# Patient Record
Sex: Female | Born: 1980 | Race: Black or African American | Hispanic: No | Marital: Single | Smoking: Current every day smoker
Health system: Southern US, Community
[De-identification: ages and names within clinical notes are randomized; demographics above are authoritative.]

## PROBLEM LIST (undated history)

## (undated) DIAGNOSIS — N289 Disorder of kidney and ureter, unspecified: Secondary | ICD-10-CM

## (undated) DIAGNOSIS — E611 Iron deficiency: Secondary | ICD-10-CM

## (undated) DIAGNOSIS — J45909 Unspecified asthma, uncomplicated: Secondary | ICD-10-CM

## (undated) HISTORY — PX: TUBAL LIGATION: SHX77

## (undated) HISTORY — PX: BREAST SURGERY: SHX581

---

## 2015-08-07 ENCOUNTER — Emergency Department (HOSPITAL_COMMUNITY): Payer: Self-pay

## 2015-08-07 ENCOUNTER — Emergency Department (HOSPITAL_COMMUNITY)
Admission: EM | Admit: 2015-08-07 | Discharge: 2015-08-07 | Disposition: A | Discharge status: Home / Self Care | Payer: Self-pay | Attending: Emergency Medicine | Admitting: Emergency Medicine

## 2015-08-07 ENCOUNTER — Encounter (HOSPITAL_COMMUNITY): Payer: Self-pay | Admitting: *Deleted

## 2015-08-07 DIAGNOSIS — Q438 Other specified congenital malformations of intestine: Secondary | ICD-10-CM | POA: Insufficient documentation

## 2015-08-07 DIAGNOSIS — R102 Pelvic and perineal pain: Secondary | ICD-10-CM

## 2015-08-07 DIAGNOSIS — J45909 Unspecified asthma, uncomplicated: Secondary | ICD-10-CM | POA: Insufficient documentation

## 2015-08-07 DIAGNOSIS — Z79899 Other long term (current) drug therapy: Secondary | ICD-10-CM | POA: Insufficient documentation

## 2015-08-07 DIAGNOSIS — K529 Noninfective gastroenteritis and colitis, unspecified: Secondary | ICD-10-CM

## 2015-08-07 DIAGNOSIS — R1032 Left lower quadrant pain: Secondary | ICD-10-CM

## 2015-08-07 DIAGNOSIS — K6389 Other specified diseases of intestine: Secondary | ICD-10-CM

## 2015-08-07 DIAGNOSIS — N939 Abnormal uterine and vaginal bleeding, unspecified: Secondary | ICD-10-CM | POA: Insufficient documentation

## 2015-08-07 DIAGNOSIS — F17210 Nicotine dependence, cigarettes, uncomplicated: Secondary | ICD-10-CM | POA: Insufficient documentation

## 2015-08-07 HISTORY — DX: Iron deficiency: E61.1

## 2015-08-07 HISTORY — DX: Unspecified asthma, uncomplicated: J45.909

## 2015-08-07 HISTORY — DX: Disorder of kidney and ureter, unspecified: N28.9

## 2015-08-07 LAB — URINALYSIS, ROUTINE W REFLEX MICROSCOPIC
Bilirubin Urine: NEGATIVE
GLUCOSE, UA: NEGATIVE mg/dL
Ketones, ur: NEGATIVE mg/dL
Leukocytes, UA: NEGATIVE
Nitrite: NEGATIVE
PH: 6 (ref 5.0–8.0)
PROTEIN: NEGATIVE mg/dL
Specific Gravity, Urine: 1.025 (ref 1.005–1.030)

## 2015-08-07 LAB — LIPASE, BLOOD: LIPASE: 26 U/L (ref 11–51)

## 2015-08-07 LAB — CBC WITH DIFFERENTIAL/PLATELET
BASOS ABS: 0.1 10*3/uL (ref 0.0–0.1)
Basophils Relative: 1 %
EOS PCT: 3 %
Eosinophils Absolute: 0.3 10*3/uL (ref 0.0–0.7)
HCT: 33.2 % — ABNORMAL LOW (ref 36.0–46.0)
Hemoglobin: 10.7 g/dL — ABNORMAL LOW (ref 12.0–15.0)
LYMPHS ABS: 2.3 10*3/uL (ref 0.7–4.0)
LYMPHS PCT: 25 %
MCH: 25.2 pg — AB (ref 26.0–34.0)
MCHC: 32.2 g/dL (ref 30.0–36.0)
MCV: 78.1 fL (ref 78.0–100.0)
MONO ABS: 0.8 10*3/uL (ref 0.1–1.0)
MONOS PCT: 8 %
Neutro Abs: 6.1 10*3/uL (ref 1.7–7.7)
Neutrophils Relative %: 63 %
PLATELETS: 393 10*3/uL (ref 150–400)
RBC: 4.25 MIL/uL (ref 3.87–5.11)
RDW: 13.7 % (ref 11.5–15.5)
WBC: 9.5 10*3/uL (ref 4.0–10.5)

## 2015-08-07 LAB — COMPREHENSIVE METABOLIC PANEL
ALT: 18 U/L (ref 14–54)
AST: 20 U/L (ref 15–41)
Albumin: 3.9 g/dL (ref 3.5–5.0)
Alkaline Phosphatase: 82 U/L (ref 38–126)
Anion gap: 4 — ABNORMAL LOW (ref 5–15)
BUN: 16 mg/dL (ref 6–20)
CHLORIDE: 105 mmol/L (ref 101–111)
CO2: 27 mmol/L (ref 22–32)
CREATININE: 0.8 mg/dL (ref 0.44–1.00)
Calcium: 8.7 mg/dL — ABNORMAL LOW (ref 8.9–10.3)
Glucose, Bld: 116 mg/dL — ABNORMAL HIGH (ref 65–99)
POTASSIUM: 4.1 mmol/L (ref 3.5–5.1)
SODIUM: 136 mmol/L (ref 135–145)
Total Bilirubin: 0.5 mg/dL (ref 0.3–1.2)
Total Protein: 7.5 g/dL (ref 6.5–8.1)

## 2015-08-07 LAB — WET PREP, GENITAL
SPERM: NONE SEEN
TRICH WET PREP: NONE SEEN
YEAST WET PREP: NONE SEEN

## 2015-08-07 LAB — URINE MICROSCOPIC-ADD ON: WBC, UA: NONE SEEN WBC/hpf (ref 0–5)

## 2015-08-07 MED ORDER — OXYCODONE HCL 5 MG PO TABS
5.0000 mg | ORAL_TABLET | Freq: Once | ORAL | Status: AC
  Administered 2015-08-07: 5 mg via ORAL
  Filled 2015-08-07: qty 1

## 2015-08-07 MED ORDER — ONDANSETRON HCL 4 MG/2ML IJ SOLN
4.0000 mg | Freq: Once | INTRAMUSCULAR | Status: AC
  Administered 2015-08-07: 4 mg via INTRAVENOUS
  Filled 2015-08-07: qty 2

## 2015-08-07 MED ORDER — MORPHINE SULFATE (PF) 4 MG/ML IV SOLN
4.0000 mg | Freq: Once | INTRAVENOUS | Status: AC
Start: 2015-08-07 — End: 2015-08-07
  Administered 2015-08-07: 4 mg via INTRAVENOUS
  Filled 2015-08-07: qty 1

## 2015-08-07 MED ORDER — DIATRIZOATE MEGLUMINE & SODIUM 66-10 % PO SOLN
ORAL | Status: AC
  Filled 2015-08-07: qty 30

## 2015-08-07 MED ORDER — SODIUM CHLORIDE 0.9 % IV BOLUS (SEPSIS)
1000.0000 mL | Freq: Once | INTRAVENOUS | Status: AC
  Administered 2015-08-07: 1000 mL via INTRAVENOUS

## 2015-08-07 MED ORDER — IOPAMIDOL (ISOVUE-300) INJECTION 61%
100.0000 mL | Freq: Once | INTRAVENOUS | Status: AC | PRN
  Administered 2015-08-07: 100 mL via INTRAVENOUS

## 2015-08-07 MED ORDER — MORPHINE SULFATE (PF) 4 MG/ML IV SOLN
INTRAVENOUS | Status: AC
  Filled 2015-08-07: qty 1

## 2015-08-07 MED ORDER — MORPHINE SULFATE (PF) 4 MG/ML IV SOLN
4.0000 mg | Freq: Once | INTRAVENOUS | Status: AC
  Administered 2015-08-07: 4 mg via INTRAVENOUS

## 2015-08-07 NOTE — ED Provider Notes (Signed)
[facility] DEPT Provider Note   CSN: [id number] Arrival date & time: 08/07/15  6962  First Provider Contact:  First MD Initiated Contact with Patient 08/07/15 [phone]        History   Chief Complaint Chief Complaint  Patient presents with  . Abdominal Pain    HPI Sophia Levine is a 35 y.o. female.  Patient with L sided abdominal pain that onset during the day yesterday.  She was seen at [facility] ED last night, had a CT scan that showed "appendicitis" but was sent home because "I don't have insurance".  [place] discharge summary shows diagnosis of "epiploic appendigitis".  Today pain has worsened and she has had several episode of nausea and vomiting. One episode of diarrhea.  On menses now. No abnormal vaginal bleeding or discharge. No dysuria or hematuria.  No previous abdominal surgeries, has never had this abdominal pain before.    The history is provided by the patient and a relative.  Abdominal Pain   Associated symptoms include diarrhea, nausea and vomiting. Pertinent negatives include fever, dysuria, hematuria, arthralgias and myalgias.    Past Medical History:  Diagnosis Date  . Asthma   . Iron deficiency   . Renal disorder    kidney stones    There are no active problems to display for this patient.   Past Surgical History:  Procedure Laterality Date  . BREAST SURGERY     reduction  . TUBAL LIGATION      OB History    No data available       Home Medications    Prior to Admission medications   Medication Sig Start Date End Date Taking? Authorizing Provider  albuterol (ACCUNEB) 0.63 MG/3ML nebulizer solution Take 1 ampule by nebulization every 6 (six) hours as needed for wheezing.   Yes Historical Provider, MD  Albuterol Sulfate 108 (90 Base) MCG/ACT AEPB Inhale into the lungs.   Yes Historical Provider, MD    Family History No family history on file.  Social History Social History  Substance Use Topics  . Smoking status: Current Every Day  Smoker    Packs/day: 0.50    Types: Cigarettes  . Smokeless tobacco: Never Used  . Alcohol use Yes     Comment: occ.     Allergies   Tylenol [acetaminophen]   Review of Systems Review of Systems  Constitutional: Positive for activity change and appetite change. Negative for fever.  HENT: Negative for ear discharge and voice change.   Respiratory: Negative for cough, chest tightness and shortness of breath.   Cardiovascular: Negative for chest pain.  Gastrointestinal: Positive for abdominal pain, diarrhea, nausea and vomiting.  Genitourinary: Positive for vaginal bleeding. Negative for dysuria, hematuria and vaginal discharge.  Musculoskeletal: Positive for back pain. Negative for arthralgias and myalgias.  Neurological: Negative for dizziness, weakness and light-headedness.   A complete 10 system review of systems was obtained and all systems are negative except as noted in the HPI and PMH.    Physical Exam Updated Vital Signs BP 127/96 (BP Location: Left Arm)   Pulse 62   Temp 97.6 F (36.4 C) (Oral)   Resp 20   Ht  (1.626 m)   Wt 170 lb (77.1 kg)   LMP 08/02/2015 (Exact Date)   SpO2 100%   BMI 29.18 kg/m   Physical Exam  Constitutional: She is oriented to person, place, and time. She appears well-developed and well-nourished. No distress.  uncomfortable  HENT:  Head: Normocephalic and atraumatic.  Mouth/Throat: Oropharynx is clear and moist. No oropharyngeal exudate.  Eyes: Conjunctivae and EOM are normal. Pupils are equal, round, and reactive to light.  Neck: Normal range of motion. Neck supple.  No meningismus.  Cardiovascular: Normal rate, regular rhythm, normal heart sounds and intact distal pulses.   No murmur heard. Pulmonary/Chest: Effort normal and breath sounds normal. No respiratory distress.  Abdominal: Soft. There is tenderness. There is guarding. There is no rebound.  TTP LLQ with guarding  Genitourinary:  Genitourinary Comments: Chaperone  present. Normal external genitalia. Dark blood in vaginal vault. No CMT. Left-sided adnexal tenderness.  Musculoskeletal: Normal range of motion. She exhibits no edema or tenderness.  Paraspinal lumbar tenderness  Neurological: She is alert and oriented to person, place, and time. No cranial nerve deficit. She exhibits normal muscle tone. Coordination normal.  No ataxia on finger to nose bilaterally. No pronator drift. 5/5 strength throughout. CN 2-12 intact.Equal grip strength. Sensation intact.   Skin: Skin is warm.  Psychiatric: She has a normal mood and affect. Her behavior is normal.  Nursing note and vitals reviewed.    ED Treatments / Results  Labs (all labs ordered are listed, but only abnormal results are displayed) Labs Reviewed  WET PREP, GENITAL - Abnormal; Notable for the following:       Result Value   Clue Cells Wet Prep HPF POC PRESENT (*)    WBC, Wet Prep HPF POC FEW (*)    All other components within normal limits  URINALYSIS, ROUTINE W REFLEX MICROSCOPIC (NOT AT [facility]) - Abnormal; Notable for the following:    Hgb urine dipstick LARGE (*)    All other components within normal limits  CBC WITH DIFFERENTIAL/PLATELET - Abnormal; Notable for the following:    Hemoglobin 10.7 (*)    HCT 33.2 (*)    MCH 25.2 (*)    All other components within normal limits  COMPREHENSIVE METABOLIC PANEL - Abnormal; Notable for the following:    Glucose, Bld 116 (*)    Calcium 8.7 (*)    Anion gap 4 (*)    All other components within normal limits  URINE MICROSCOPIC-ADD ON - Abnormal; Notable for the following:    Squamous Epithelial / LPF 0-5 (*)    Bacteria, UA FEW (*)    All other components within normal limits  LIPASE, BLOOD  GC/CHLAMYDIA PROBE AMP ([place]) NOT AT [facility]    EKG  EKG Interpretation None       Radiology US Transvaginal Non-ob  Result Date: 08/07/2015 CLINICAL DATA:  Pelvic pain for 2 days. History of prior tubal ligation. EXAM: TRANSABDOMINAL AND  TRANSVAGINAL ULTRASOUND OF PELVIS DOPPLER ULTRASOUND OF OVARIES TECHNIQUE: Both transabdominal and transvaginal ultrasound examinations of the pelvis were performed. Transabdominal technique was performed for global imaging of the pelvis including uterus, ovaries, adnexal regions, and pelvic cul-de-sac. It was necessary to proceed with endovaginal exam following the transabdominal exam to visualize the ovaries. Color and duplex Doppler ultrasound was utilized to evaluate blood flow to the ovaries. COMPARISON:  None. FINDINGS: Uterus Measurements: 8.3 x 5.0 x 5.6 cm. No fibroids or other mass visualized. Endometrium Thickness: 0.5 cm.  No focal abnormality visualized. Right ovary Measurements: 2.8 x 1.6 x 3.5 cm. Normal appearance/no adnexal mass. Left ovary Measurements: 2.9 x 1.7 x 2.5 cm. Normal appearance/no adnexal mass. Pulsed Doppler evaluation of both ovaries demonstrates normal low-resistance arterial and venous waveforms. Other findings No abnormal free fluid. IMPRESSION: Normal examination Electronically Signed   By: Maisie Fus  Dalessio M.D.   On: 08/07/2015 11:10  US Pelvis Complete  Result Date: 08/07/2015 CLINICAL DATA:  Pelvic pain for 2 days. History of prior tubal ligation. EXAM: TRANSABDOMINAL AND TRANSVAGINAL ULTRASOUND OF PELVIS DOPPLER ULTRASOUND OF OVARIES TECHNIQUE: Both transabdominal and transvaginal ultrasound examinations of the pelvis were performed. Transabdominal technique was performed for global imaging of the pelvis including uterus, ovaries, adnexal regions, and pelvic cul-de-sac. It was necessary to proceed with endovaginal exam following the transabdominal exam to visualize the ovaries. Color and duplex Doppler ultrasound was utilized to evaluate blood flow to the ovaries. COMPARISON:  None. FINDINGS: Uterus Measurements: 8.3 x 5.0 x 5.6 cm. No fibroids or other mass visualized. Endometrium Thickness: 0.5 cm.  No focal abnormality visualized. Right ovary Measurements: 2.8 x 1.6 x  3.5 cm. Normal appearance/no adnexal mass. Left ovary Measurements: 2.9 x 1.7 x 2.5 cm. Normal appearance/no adnexal mass. Pulsed Doppler evaluation of both ovaries demonstrates normal low-resistance arterial and venous waveforms. Other findings No abnormal free fluid. IMPRESSION: Normal examination Electronically Signed   By: Drusilla Kanner M.D.   On: 08/07/2015 11:10  Ct Abdomen Pelvis W Contrast  Result Date: 08/07/2015 CLINICAL DATA:  Left lower quadrant pain, nausea/vomiting EXAM: CT ABDOMEN AND PELVIS WITH CONTRAST TECHNIQUE: Multidetector CT imaging of the abdomen and pelvis was performed using the standard protocol following bolus administration of intravenous contrast. CONTRAST:  ISOVUE-300 IOPAMIDOL (ISOVUE-300) INJECTION 61% COMPARISON:  None. FINDINGS: Lower chest:  Lung bases are clear. Hepatobiliary: Liver is within normal limits. No suspicious/enhancing hepatic lesions. Gallbladder is unremarkable. No intrahepatic or extrahepatic ductal dilatation. Pancreas: Within normal limits. Spleen: Within normal limits. Adrenals/Urinary Tract: Adrenal glands are within normal limits. Kidneys are within normal limits.  No hydronephrosis. Bladder is within normal limits. Stomach/Bowel: Stomach is notable for a tiny hiatal hernia. No evidence of bowel obstruction. Normal appendix (series 2/ image 46). 1.7 x 1.6 cm fat density lesion with mild inflammatory changes adjacent to the descending colon (series 2/ image 51), likely reflecting epiploic appendagitis, less likely an omental infarct. Vascular/Lymphatic: No evidence of abdominal aortic aneurysm. No suspicious abdominopelvic lymphadenopathy. Reproductive: Uterus is within normal limits. Bilateral ovaries are within normal limits. Other: No abdominopelvic ascites. Musculoskeletal: Visualized osseous structures are within normal limits. IMPRESSION: Suspected epiploic appendagitis along the descending colon, less likely an omental infarct. Regardless,  this is a benign self limited condition. No evidence of bowel obstruction.  Normal appendix. Electronically Signed   By: Charline Bills M.D.   On: 08/07/2015 13:01  [place] Art/ven Flow Abd Pelv Doppler  Result Date: 08/07/2015 CLINICAL DATA:  Pelvic pain for 2 days. History of prior tubal ligation. EXAM: TRANSABDOMINAL AND TRANSVAGINAL ULTRASOUND OF PELVIS DOPPLER ULTRASOUND OF OVARIES TECHNIQUE: Both transabdominal and transvaginal ultrasound examinations of the pelvis were performed. Transabdominal technique was performed for global imaging of the pelvis including uterus, ovaries, adnexal regions, and pelvic cul-de-sac. It was necessary to proceed with endovaginal exam following the transabdominal exam to visualize the ovaries. Color and duplex Doppler ultrasound was utilized to evaluate blood flow to the ovaries. COMPARISON:  None. FINDINGS: Uterus Measurements: 8.3 x 5.0 x 5.6 cm. No fibroids or other mass visualized. Endometrium Thickness: 0.5 cm.  No focal abnormality visualized. Right ovary Measurements: 2.8 x 1.6 x 3.5 cm. Normal appearance/no adnexal mass. Left ovary Measurements: 2.9 x 1.7 x 2.5 cm. Normal appearance/no adnexal mass. Pulsed Doppler evaluation of both ovaries demonstrates normal low-resistance arterial and venous waveforms. Other findings No abnormal free fluid. IMPRESSION: Normal examination  Electronically Signed   By: Drusilla Kanner M.D.   On: 08/07/2015 11:10   Procedures Procedures (including critical care time)  Medications Ordered in ED Medications  diatrizoate meglumine-sodium (GASTROGRAFIN) 66-10 % solution (not administered)  morphine 4 MG/ML injection 4 mg (4 mg Intravenous Given 08/07/15 0818)  ondansetron (ZOFRAN) injection 4 mg (4 mg Intravenous Given 08/07/15 0818)  sodium chloride 0.9 % bolus 1,000 mL (0 mLs Intravenous Stopped 08/07/15 1000)  morphine 4 MG/ML injection 4 mg (4 mg Intravenous Given 08/07/15 0957)  iopamidol (ISOVUE-300) 61 % injection 100 mL  (100 mLs Intravenous Contrast Given 08/07/15 1237)  oxyCODONE (Oxy IR/ROXICODONE) immediate release tablet 5 mg (5 mg Oral Given 08/07/15 1427)     Initial Impression / Assessment and Plan / ED Course  I have reviewed the triage vital signs and the nursing notes.  Pertinent labs & imaging results that were available during my care of the patient were reviewed by me and considered in my medical decision making (see chart for details).  Clinical Course  L sided abdominal pain with nausea, vomiting, diarrhea.  CT yesterday at [facility] will be obtained.  Records obtained from [facility]. CT scan performed at July 27 showed normal appendix, right-sided nephrolithiasis without obstruction, fat stranding of the anterior to distal left ascending colon likely related to epiploic appendage angitis or focal diverticulitis.  Pelvic exam benign.  US shows no torsion or ovarian pathology UA with blood, on menses. HCG negative at [facility]  CT scan consistent with epiploic appendigitis. No appendicitis.  Patient reassured.  She has pain meds prescription. from [place].  Followup with PCP. Return precautions discussed.  Final Clinical Impressions(s) / ED Diagnoses   Final diagnoses:  Pelvic pain in female  Left lower quadrant pain  Epiploic appendagitis    New Prescriptions Discharge Medication List as of 08/07/2015  2:20 PM       Glynn Octave, MD 08/07/15 1705

## 2015-08-07 NOTE — ED Triage Notes (Signed)
Pt comes in for LLQ abdominal pain. Pt was seen yesterday and discharged from [facility] for appendicitis. Pt started having n/v/d starting today.

## 2015-08-07 NOTE — Discharge Instructions (Signed)
Your appendix is normal. Your pain should improve with the pain medication. Followup with your doctor. Return to the ED if you develop new or worsening symptoms.

## 2015-08-07 NOTE — ED Notes (Signed)
Records received from [facility]. Patient with undetectable HCG yesterday (neg preg), verbal order to discontinue urine preg here.

## 2015-08-07 NOTE — ED Notes (Signed)
Obtained written consent to gain medical records from [facility]. Faxed by secretary to [facility] medical records office.

## 2015-08-10 LAB — GC/CHLAMYDIA PROBE AMP (~~LOC~~) NOT AT ARMC
CHLAMYDIA, DNA PROBE: NEGATIVE
NEISSERIA GONORRHEA: NEGATIVE

## 2018-01-11 IMAGING — US US PELVIS COMPLETE
1 series · 14 of 25 positions shown · non-contrast
Comparison: None.

CLINICAL DATA: Pelvic pain for 2 days. History of prior tubal
ligation.

EXAM:
TRANSABDOMINAL AND TRANSVAGINAL ULTRASOUND OF PELVIS
DOPPLER ULTRASOUND OF OVARIES
TECHNIQUE: Both transabdominal and transvaginal ultrasound examinations of the
pelvis were performed. Transabdominal technique was performed for
global imaging of the pelvis including uterus, ovaries, adnexal
regions, and pelvic cul-de-sac.
It was necessary to proceed with endovaginal exam following the
transabdominal exam to visualize the ovaries. Color and duplex
Doppler ultrasound was utilized to evaluate blood flow to the
ovaries.

[Series 1: us pelvis complete · 0.22mm/px · 14 of 102 slices shown]
[im 1/102]
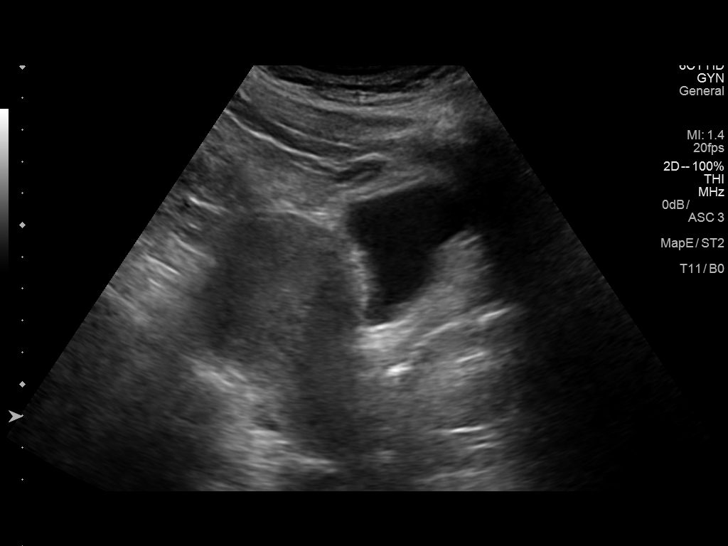
[im 9/102]
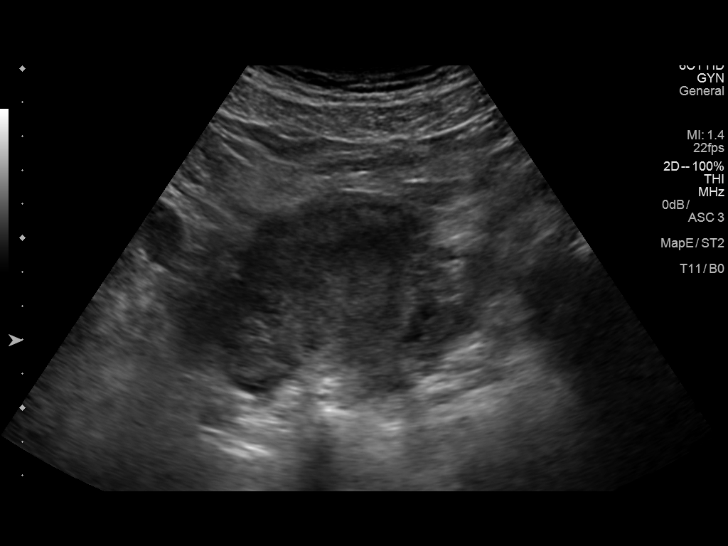
[im 17/102]
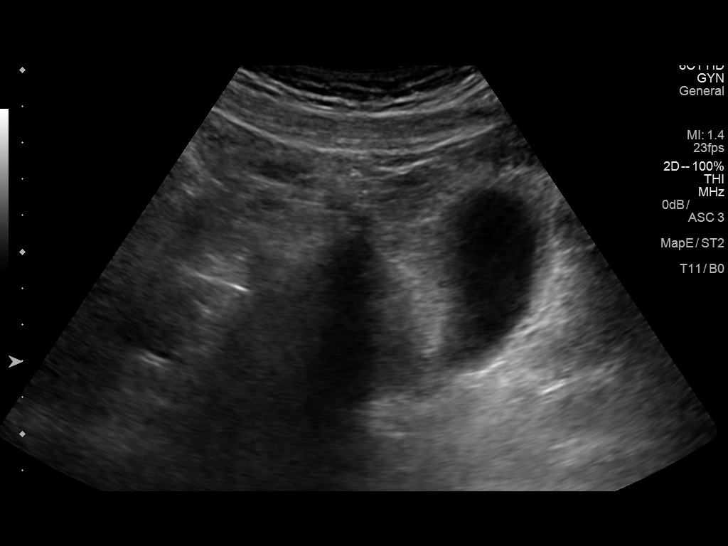
[im 26/102]
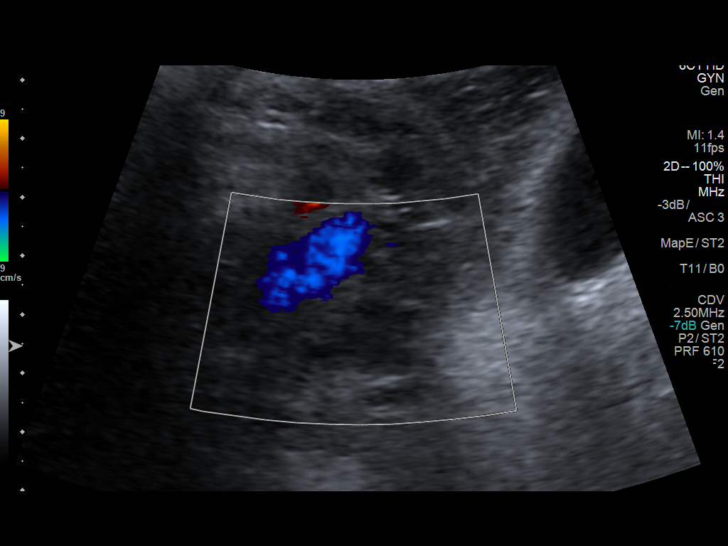
[im 34/102]
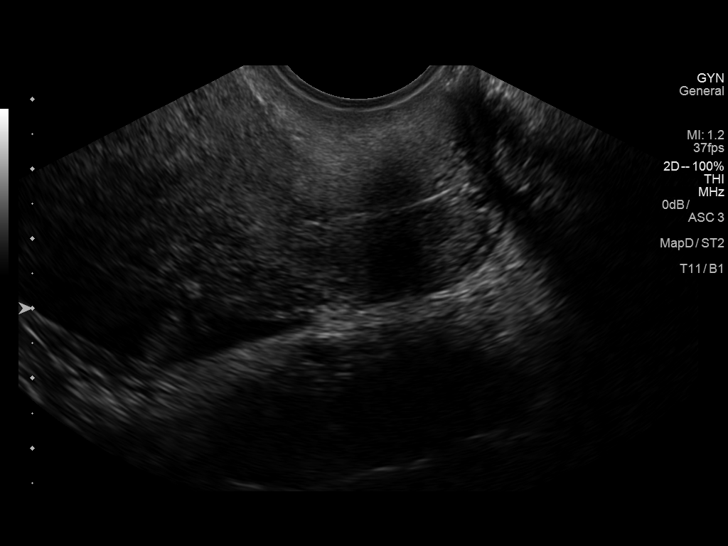
[im 38/102]
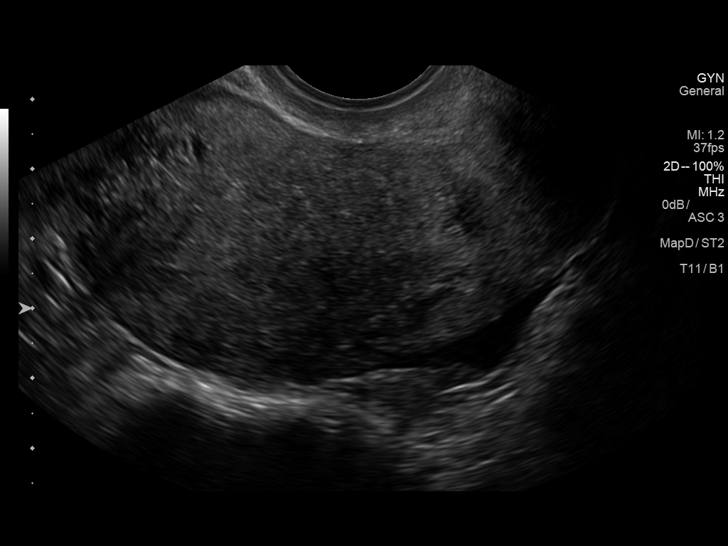
[im 47/102]
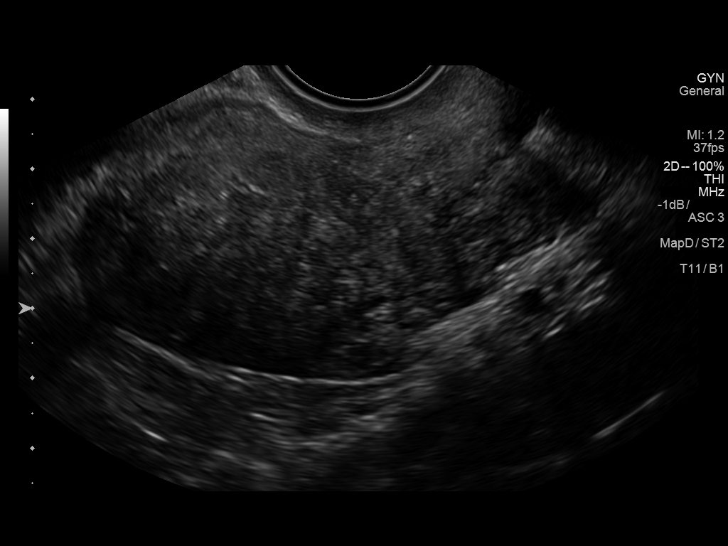
[im 55/102]
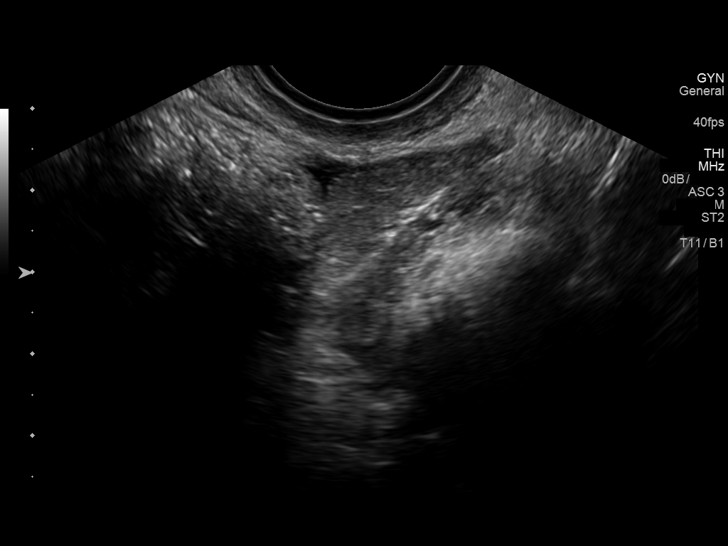
[im 64/102]
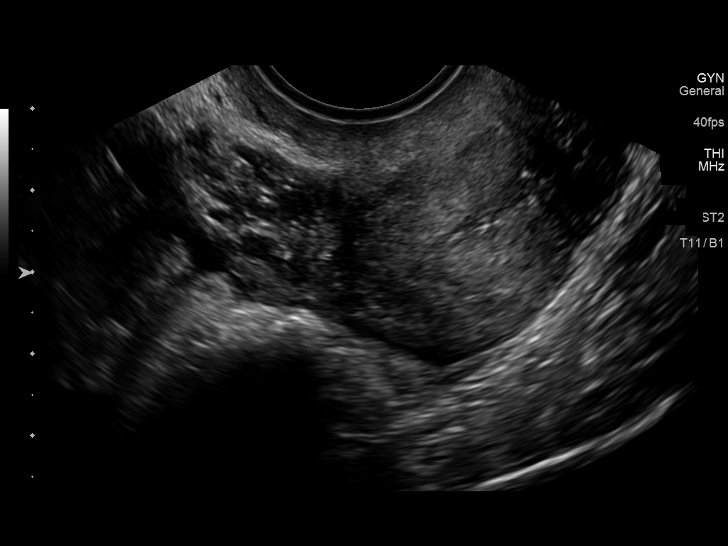
[im 68/102]
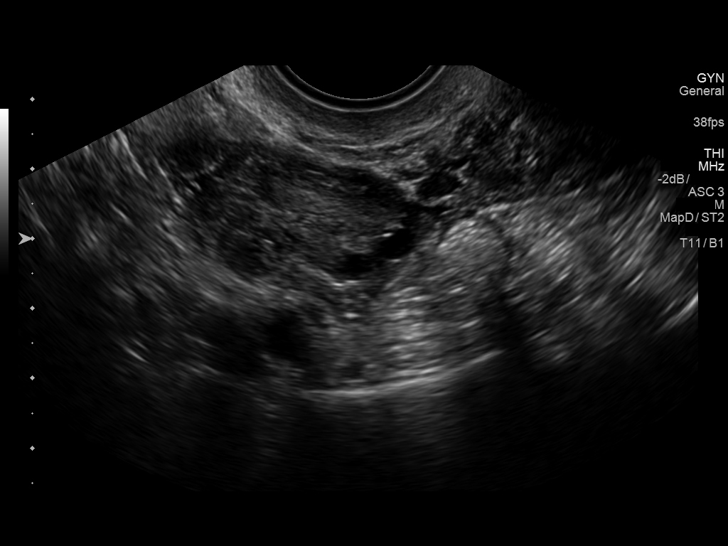
[im 76/102]
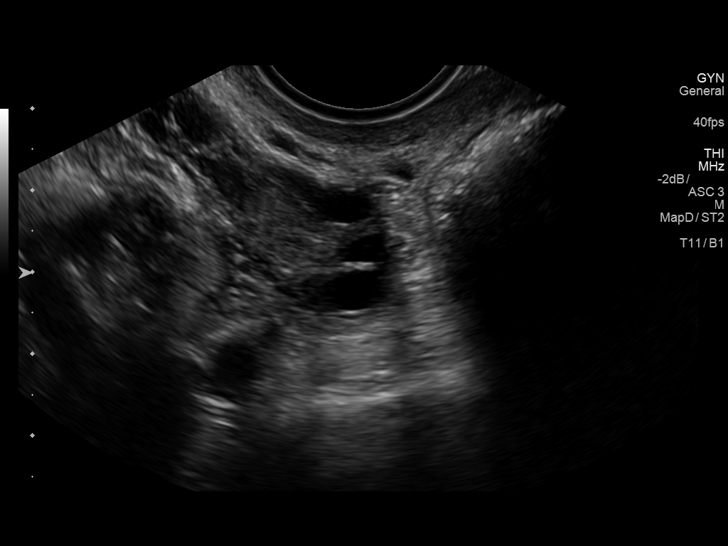
[im 85/102]
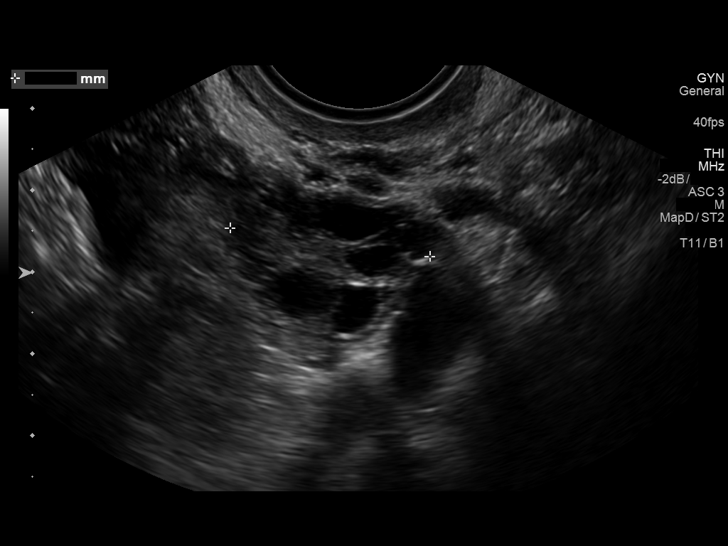
[im 93/102]
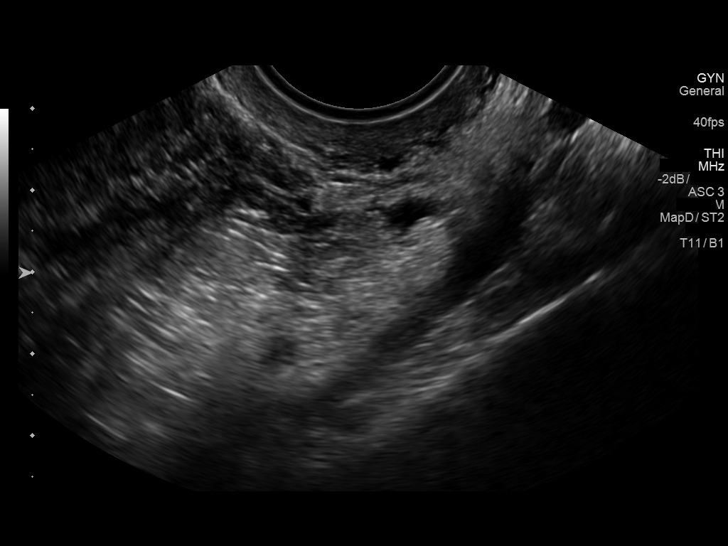
[im 102/102]
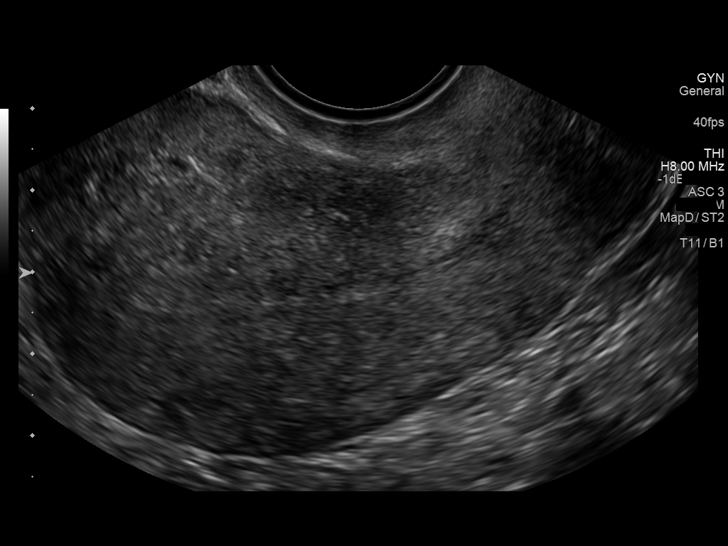

[14 of 25 positions shown; findings below may reference images not displayed]

FINDINGS: Uterus

Measurements: 8.3 x 5.0 x 5.6 cm. No fibroids or other mass
visualized.

Endometrium

Thickness: 0.5 cm.  No focal abnormality visualized.

Right ovary

Measurements: 2.8 x 1.6 x 3.5 cm. Normal appearance/no adnexal mass.

Left ovary

Measurements: 2.9 x 1.7 x 2.5 cm. Normal appearance/no adnexal mass.

Pulsed Doppler evaluation of both ovaries demonstrates normal
low-resistance arterial and venous waveforms.

Other findings

No abnormal free fluid.
IMPRESSION: Normal examination

## 2018-01-17 IMAGING — CT CT ABD-PELV W/ CM
2 of 5 series · 16 of 46 positions shown, 18 images · IV contrast (iopamidol)
Comparison: None.

CLINICAL DATA: Left lower quadrant pain, nausea/vomiting

EXAM:
CT ABDOMEN AND PELVIS WITH CONTRAST
TECHNIQUE: Multidetector CT imaging of the abdomen and pelvis was performed
using the standard protocol following bolus administration of
intravenous contrast.
CONTRAST:  100mL [id number] IOPAMIDOL ([id number]) INJECTION 61%

[Series 2: routine abd pel with · axial · 0.70mm/px · z∈[-420,-20]mm · 13 of 89 slices shown, 15 images]
[im 5/89  soft-tissue]
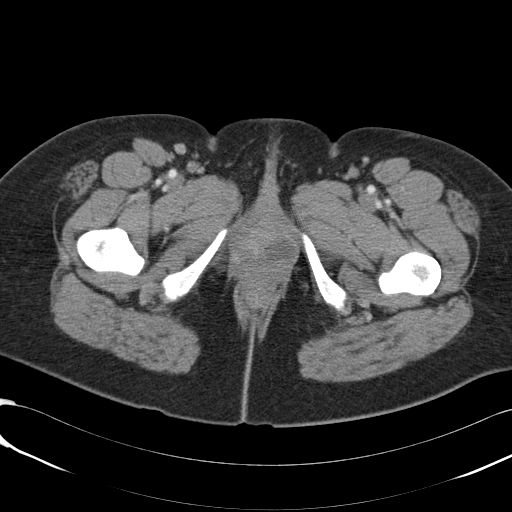
[im 5/89  bone]
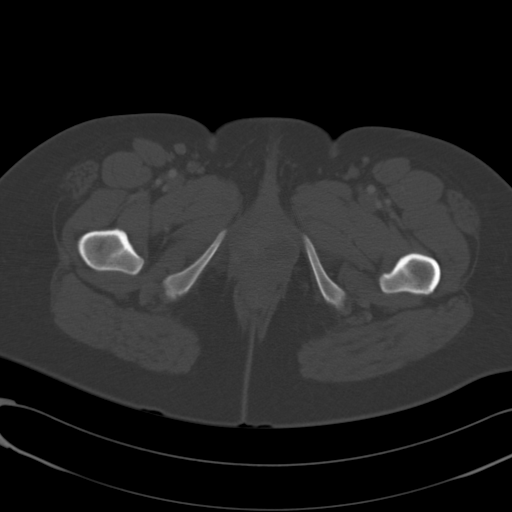
[im 13/89  soft-tissue]
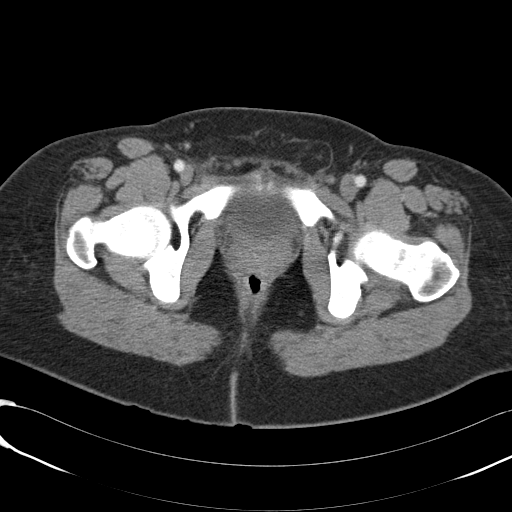
[im 21/89  soft-tissue]
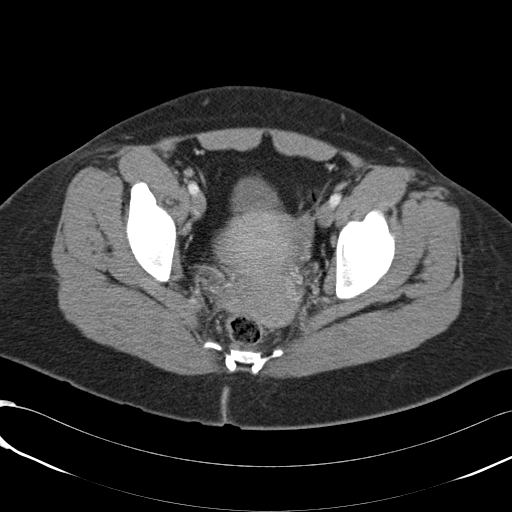
[im 25/89  soft-tissue]
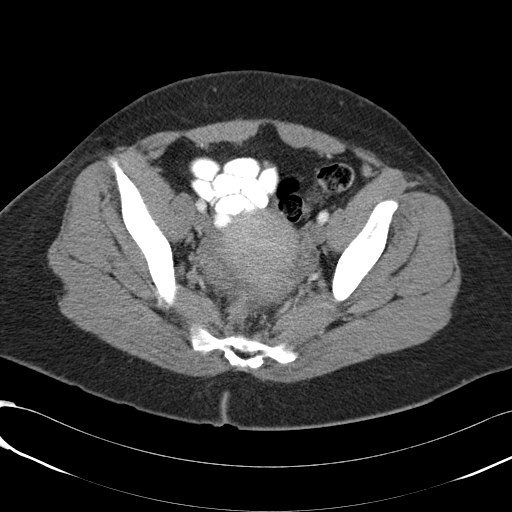
[im 33/89  soft-tissue]
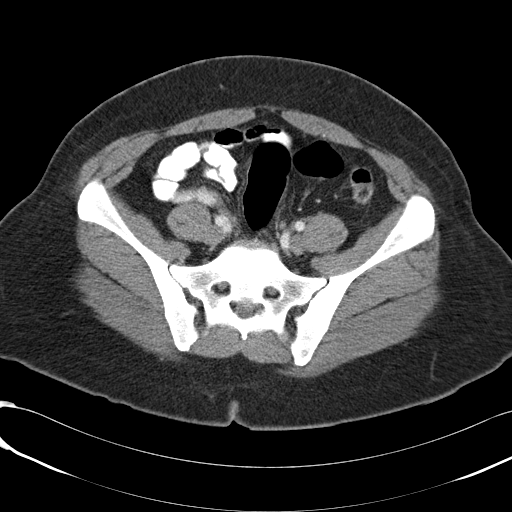
[im 37/89  soft-tissue]
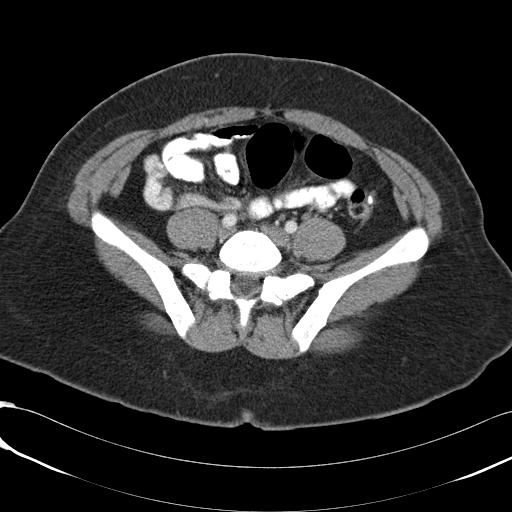
[im 45/89  soft-tissue]
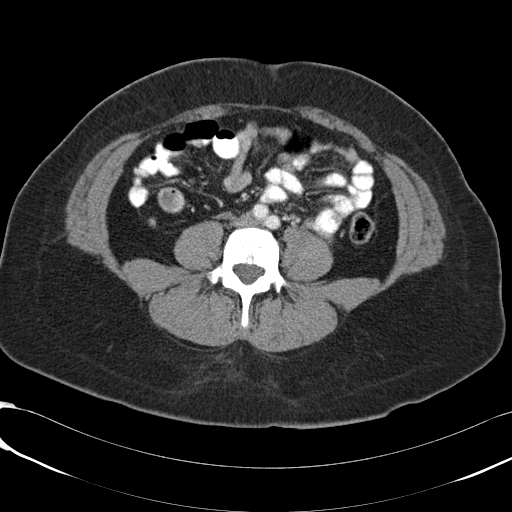
[im 53/89  soft-tissue]
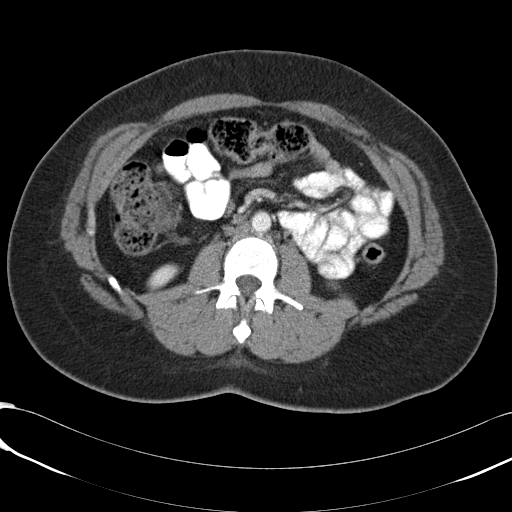
[im 57/89  soft-tissue]
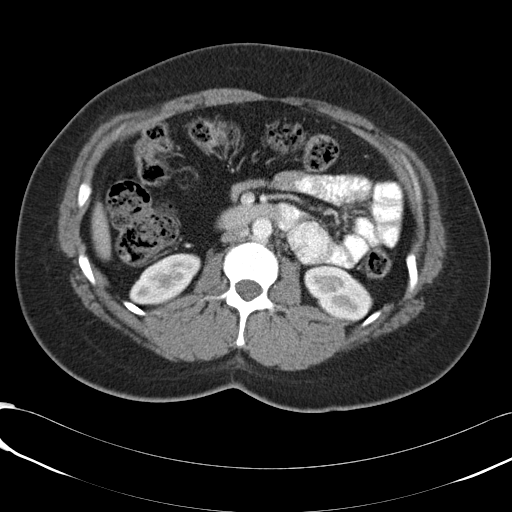
[im 57/89  bone]
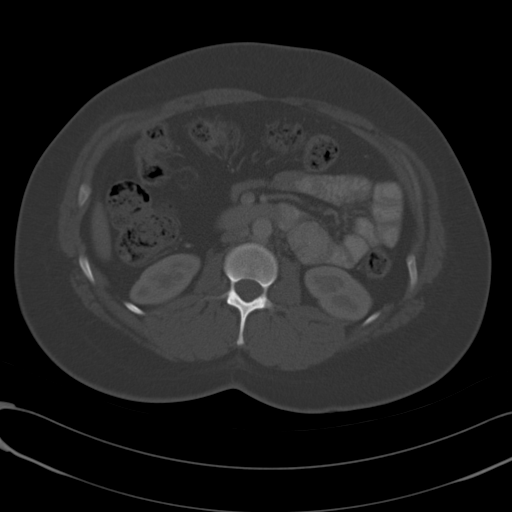
[im 65/89  soft-tissue]
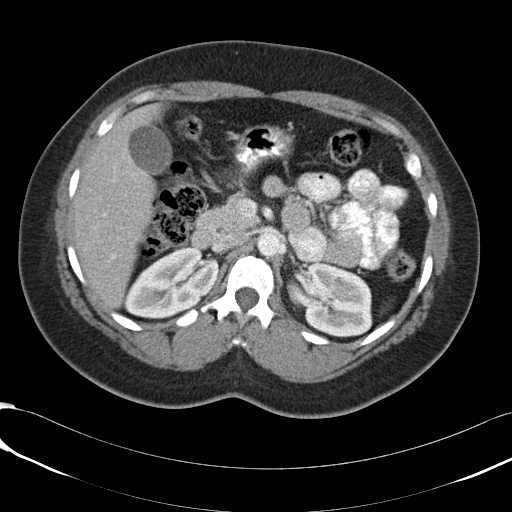
[im 69/89  soft-tissue]
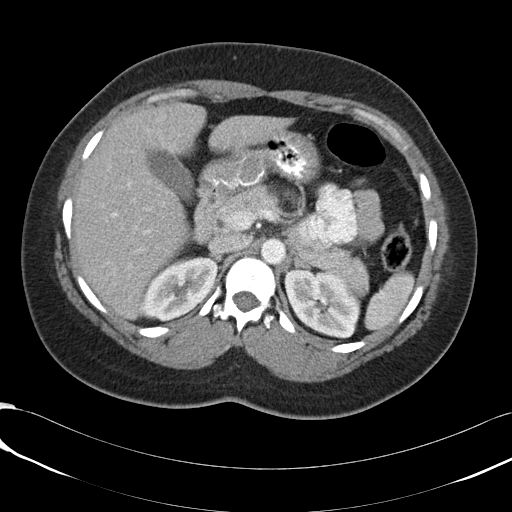
[im 77/89  soft-tissue]
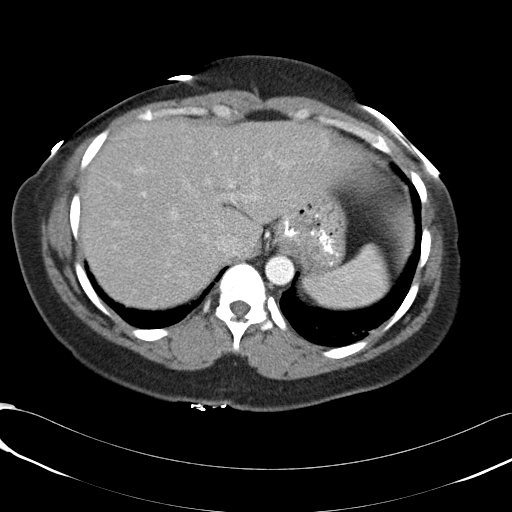
[im 85/89  soft-tissue]
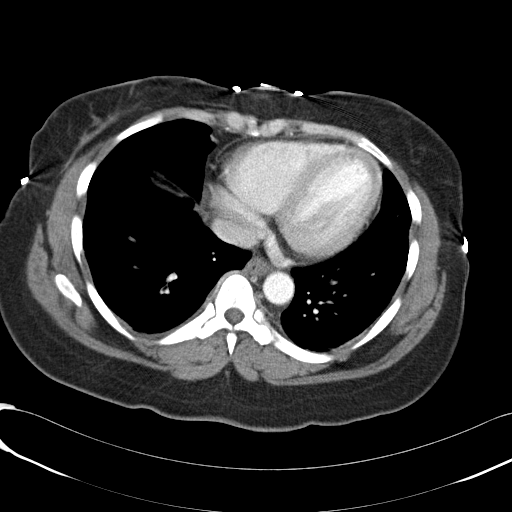

[Series 4: coronal · coronal · 0.71mm/px · 3 of 116 slices shown]
[im 39/116  soft-tissue]
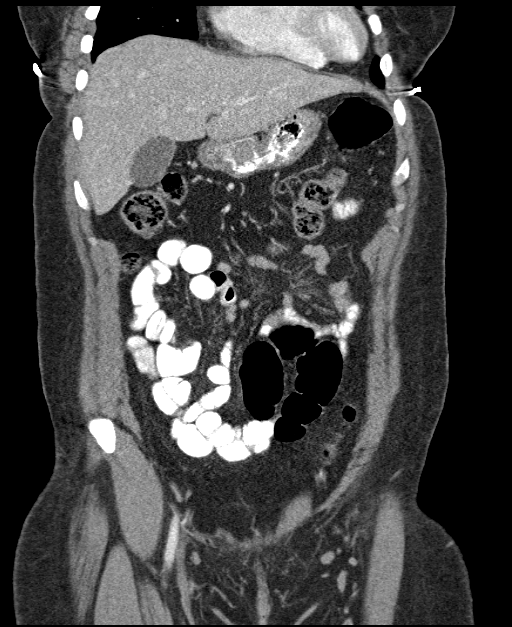
[im 52/116  soft-tissue]
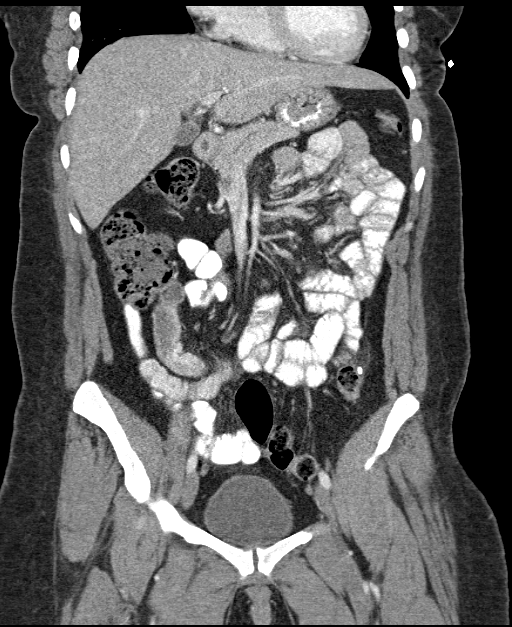
[im 64/116  soft-tissue]
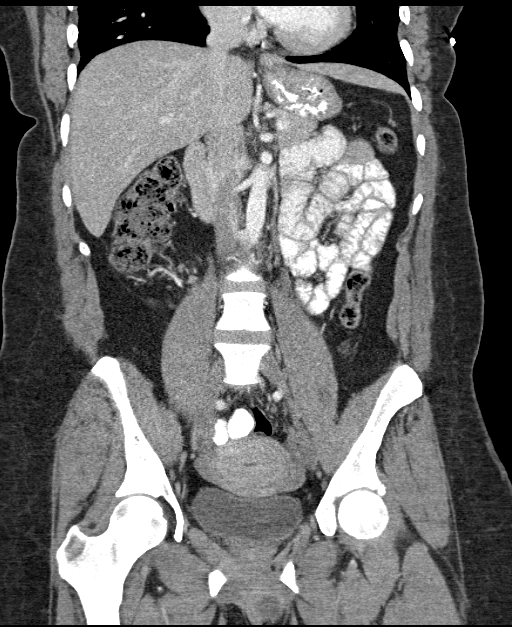

[16 of 46 positions shown; findings below may reference images not displayed]

FINDINGS: Lower chest:  Lung bases are clear.

Hepatobiliary: Liver is within normal limits. No
suspicious/enhancing hepatic lesions.

Gallbladder is unremarkable. No intrahepatic or extrahepatic ductal
dilatation.

Pancreas: Within normal limits.

Spleen: Within normal limits.

Adrenals/Urinary Tract: Adrenal glands are within normal limits.

Kidneys are within normal limits.  No hydronephrosis.

Bladder is within normal limits.

Stomach/Bowel: Stomach is notable for a tiny hiatal hernia.

No evidence of bowel obstruction.

Normal appendix (series 2/ image 46).

1.7 x 1.6 cm fat density lesion with mild inflammatory changes
adjacent to the descending colon (series 2/ image 51), likely
reflecting epiploic appendagitis, less likely an omental infarct.

Vascular/Lymphatic: No evidence of abdominal aortic aneurysm.

No suspicious abdominopelvic lymphadenopathy.

Reproductive: Uterus is within normal limits.

Bilateral ovaries are within normal limits.

Other: No abdominopelvic ascites.

Musculoskeletal: Visualized osseous structures are within normal
limits.
IMPRESSION: Suspected epiploic appendagitis along the descending colon, less
likely an omental infarct. Regardless, this is a benign self limited
condition.

No evidence of bowel obstruction.  Normal appendix.
